# Patient Record
Sex: Female | Born: 1960 | Race: Black or African American | Hispanic: No | Marital: Married | Smoking: Never smoker
Health system: Southern US, Community
[De-identification: ages and names within clinical notes are randomized; demographics above are authoritative.]

## PROBLEM LIST (undated history)

## (undated) DIAGNOSIS — J45909 Unspecified asthma, uncomplicated: Secondary | ICD-10-CM

## (undated) DIAGNOSIS — I10 Essential (primary) hypertension: Secondary | ICD-10-CM

## (undated) DIAGNOSIS — R4189 Other symptoms and signs involving cognitive functions and awareness: Secondary | ICD-10-CM

## (undated) DIAGNOSIS — F319 Bipolar disorder, unspecified: Secondary | ICD-10-CM

## (undated) HISTORY — PX: TONSILLECTOMY: SUR1361

## (undated) HISTORY — PX: CHOLECYSTECTOMY: SHX55

## (undated) HISTORY — PX: ABDOMINAL HYSTERECTOMY: SHX81

## (undated) HISTORY — PX: EYE SURGERY: SHX253

---

## 2001-07-24 ENCOUNTER — Encounter: Admission: RE | Admit: 2001-07-24 | Discharge: 2001-07-24 | Payer: Self-pay | Admitting: Internal Medicine

## 2001-09-04 ENCOUNTER — Encounter: Admission: RE | Admit: 2001-09-04 | Discharge: 2001-09-04 | Payer: Self-pay | Admitting: Internal Medicine

## 2001-09-17 ENCOUNTER — Encounter: Admission: RE | Admit: 2001-09-17 | Discharge: 2001-10-28 | Payer: Self-pay | Admitting: Internal Medicine

## 2002-01-22 ENCOUNTER — Encounter: Admission: RE | Admit: 2002-01-22 | Discharge: 2002-01-22 | Payer: Self-pay | Admitting: Internal Medicine

## 2002-01-28 ENCOUNTER — Encounter: Admission: RE | Admit: 2002-01-28 | Discharge: 2002-01-28 | Payer: Self-pay | Admitting: Internal Medicine

## 2002-02-19 ENCOUNTER — Encounter: Admission: RE | Admit: 2002-02-19 | Discharge: 2002-02-19 | Payer: Self-pay | Admitting: Internal Medicine

## 2002-04-29 ENCOUNTER — Encounter: Admission: RE | Admit: 2002-04-29 | Discharge: 2002-04-29 | Payer: Self-pay | Admitting: Infectious Diseases

## 2009-08-13 ENCOUNTER — Encounter: Payer: Self-pay | Admitting: Emergency Medicine

## 2009-08-14 ENCOUNTER — Ambulatory Visit: Payer: Self-pay | Admitting: Psychiatry

## 2009-08-14 ENCOUNTER — Inpatient Hospital Stay (HOSPITAL_COMMUNITY): Admission: RE | Admit: 2009-08-14 | Discharge: 2009-08-19 | Payer: Self-pay | Admitting: Psychiatry

## 2010-03-31 LAB — GLUCOSE, CAPILLARY
Glucose-Capillary: 141 mg/dL — ABNORMAL HIGH (ref 70–99)
Glucose-Capillary: 146 mg/dL — ABNORMAL HIGH (ref 70–99)
Glucose-Capillary: 148 mg/dL — ABNORMAL HIGH (ref 70–99)
Glucose-Capillary: 169 mg/dL — ABNORMAL HIGH (ref 70–99)
Glucose-Capillary: 175 mg/dL — ABNORMAL HIGH (ref 70–99)
Glucose-Capillary: 177 mg/dL — ABNORMAL HIGH (ref 70–99)
Glucose-Capillary: 179 mg/dL — ABNORMAL HIGH (ref 70–99)
Glucose-Capillary: 182 mg/dL — ABNORMAL HIGH (ref 70–99)
Glucose-Capillary: 187 mg/dL — ABNORMAL HIGH (ref 70–99)
Glucose-Capillary: 198 mg/dL — ABNORMAL HIGH (ref 70–99)

## 2010-04-01 LAB — RAPID URINE DRUG SCREEN, HOSP PERFORMED
Amphetamines: NOT DETECTED
Cocaine: NOT DETECTED
Opiates: NOT DETECTED
Tetrahydrocannabinol: NOT DETECTED

## 2010-04-01 LAB — DIFFERENTIAL
Basophils Absolute: 0 10*3/uL (ref 0.0–0.1)
Basophils Relative: 0 % (ref 0–1)
Eosinophils Relative: 2 % (ref 0–5)
Lymphocytes Relative: 38 % (ref 12–46)
Monocytes Relative: 6 % (ref 3–12)
Neutro Abs: 5 10*3/uL (ref 1.7–7.7)

## 2010-04-01 LAB — URINALYSIS, ROUTINE W REFLEX MICROSCOPIC
Bilirubin Urine: NEGATIVE
Ketones, ur: NEGATIVE mg/dL
Nitrite: NEGATIVE
Specific Gravity, Urine: 1.01 (ref 1.005–1.030)
Urobilinogen, UA: 1 mg/dL (ref 0.0–1.0)

## 2010-04-01 LAB — CBC
MCH: 29.7 pg (ref 26.0–34.0)
MCV: 87.7 fL (ref 78.0–100.0)
Platelets: 309 10*3/uL (ref 150–400)
RDW: 13.7 % (ref 11.5–15.5)

## 2010-04-01 LAB — COMPREHENSIVE METABOLIC PANEL
Albumin: 4.1 g/dL (ref 3.5–5.2)
BUN: 13 mg/dL (ref 6–23)
Calcium: 9.7 mg/dL (ref 8.4–10.5)
Creatinine, Ser: 0.75 mg/dL (ref 0.4–1.2)
Total Bilirubin: 0.6 mg/dL (ref 0.3–1.2)
Total Protein: 7.7 g/dL (ref 6.0–8.3)

## 2010-04-01 LAB — GLUCOSE, CAPILLARY
Glucose-Capillary: 142 mg/dL — ABNORMAL HIGH (ref 70–99)
Glucose-Capillary: 216 mg/dL — ABNORMAL HIGH (ref 70–99)

## 2010-04-01 LAB — SALICYLATE LEVEL: Salicylate Lvl: <4 mg/dL (ref 2.8–20.0)

## 2010-04-01 LAB — ACETAMINOPHEN LEVEL: Acetaminophen (Tylenol), Serum: <10 ug/mL — ABNORMAL LOW (ref 10–30)

## 2010-08-17 ENCOUNTER — Emergency Department (HOSPITAL_BASED_OUTPATIENT_CLINIC_OR_DEPARTMENT_OTHER)
Admission: EM | Admit: 2010-08-17 | Discharge: 2010-08-17 | Disposition: A | Discharge status: Home / Self Care | Payer: Medicaid Other | Attending: Emergency Medicine | Admitting: Emergency Medicine

## 2010-08-17 ENCOUNTER — Encounter: Payer: Self-pay | Admitting: *Deleted

## 2010-08-17 DIAGNOSIS — R112 Nausea with vomiting, unspecified: Secondary | ICD-10-CM

## 2010-08-17 DIAGNOSIS — F319 Bipolar disorder, unspecified: Secondary | ICD-10-CM | POA: Insufficient documentation

## 2010-08-17 DIAGNOSIS — R1033 Periumbilical pain: Secondary | ICD-10-CM | POA: Insufficient documentation

## 2010-08-17 DIAGNOSIS — E119 Type 2 diabetes mellitus without complications: Secondary | ICD-10-CM | POA: Insufficient documentation

## 2010-08-17 DIAGNOSIS — I10 Essential (primary) hypertension: Secondary | ICD-10-CM | POA: Insufficient documentation

## 2010-08-17 HISTORY — DX: Bipolar disorder, unspecified: F31.9

## 2010-08-17 HISTORY — DX: Essential (primary) hypertension: I10

## 2010-08-17 LAB — COMPREHENSIVE METABOLIC PANEL
BUN: 21 mg/dL (ref 6–23)
Calcium: 9.7 mg/dL (ref 8.4–10.5)
GFR calc Af Amer: 52 mL/min — ABNORMAL LOW (ref >60)
GFR calc non Af Amer: 43 mL/min — ABNORMAL LOW (ref >60)
Glucose, Bld: 210 mg/dL — ABNORMAL HIGH (ref 70–99)
Total Protein: 8 g/dL (ref 6.0–8.3)

## 2010-08-17 LAB — CBC
HCT: 37.1 % (ref 36.0–46.0)
Hemoglobin: 12.3 g/dL (ref 12.0–15.0)
MCH: 28.7 pg (ref 26.0–34.0)
MCV: 86.7 fL (ref 78.0–100.0)
RBC: 4.28 MIL/uL (ref 3.87–5.11)

## 2010-08-17 LAB — LIPASE, BLOOD: Lipase: 45 U/L (ref 11–59)

## 2010-08-17 LAB — DIFFERENTIAL
Eosinophils Absolute: 0.2 10*3/uL (ref 0.0–0.7)
Eosinophils Relative: 1 % (ref 0–5)
Lymphs Abs: 3.1 10*3/uL (ref 0.7–4.0)
Monocytes Absolute: 0.8 10*3/uL (ref 0.1–1.0)
Monocytes Relative: 5 % (ref 3–12)

## 2010-08-17 MED ORDER — SODIUM CHLORIDE 0.9 % IV SOLN
999.0000 mL | INTRAVENOUS | Status: DC
  Administered 2010-08-17: 1000 mL via INTRAVENOUS

## 2010-08-17 MED ORDER — ONDANSETRON HCL 4 MG/2ML IJ SOLN
4.0000 mg | Freq: Once | INTRAMUSCULAR | Status: AC
  Administered 2010-08-17: 4 mg via INTRAVENOUS
  Filled 2010-08-17: qty 2

## 2010-08-17 NOTE — ED Provider Notes (Addendum)
History     CSN: [id number] Arrival date & time: 08/17/2010  9:17 AM  Chief Complaint  Patient presents with  . Abdominal Pain   HPI Comments: Pt was at [facility].  She had several tests including a CT scan.  Patient is a 50 y.o. female presenting with abdominal pain. The history is provided by the patient.  Abdominal Pain The primary symptoms of the illness include abdominal pain, nausea, vomiting and diarrhea. The primary symptoms of the illness do not include fever. Episode onset: several days. The onset of the illness was gradual. The problem has not changed since onset. The abdominal pain is located in the periumbilical region. The abdominal pain does not radiate. Pain scale: moderate. The abdominal pain is exacerbated by vomiting and eating.  Vomiting occurs 2 to 5 times per day.  The diarrhea occurs 2 to 4 times per day.  The patient states that she believes she is currently not pregnant. Additional symptoms associated with the illness include anorexia. Symptoms associated with the illness do not include chills, constipation, urgency or back pain.    Past Medical History  Diagnosis Date  . Diabetes mellitus   . Hypertension   . Glaucoma   . Bipolar 1 disorder     Past Surgical History  Procedure Date  . Tonsillectomy   . Cholecystectomy   . Abdominal hysterectomy   . Eye surgery     History reviewed. No pertinent family history.  History  Substance Use Topics  . Smoking status: Never Smoker   . Smokeless tobacco: Not on file  . Alcohol Use: No    OB History    Grav Para Term Preterm Abortions TAB SAB Ect Mult Living                  Review of Systems  Constitutional: Negative for fever and chills.  Gastrointestinal: Positive for nausea, vomiting, abdominal pain, diarrhea and anorexia. Negative for constipation.  Genitourinary: Negative for urgency.  Musculoskeletal: Negative for back pain.  All other systems reviewed and are  negative.    Physical Exam  BP 109/69  Pulse 99  Temp(Src) 98.5 F (36.9 C) (Oral)  Resp 18  SpO2 97%  Physical Exam  Nursing note and vitals reviewed. Constitutional: No distress.       obese  HENT:  Head: Normocephalic and atraumatic.  Right Ear: External ear normal.  Left Ear: External ear normal.  Eyes: Conjunctivae are normal. Right eye exhibits no discharge. Left eye exhibits no discharge. No scleral icterus.  Neck: Neck supple. No tracheal deviation present.  Cardiovascular: Normal rate, regular rhythm and intact distal pulses.   Pulmonary/Chest: Effort normal and breath sounds normal. No stridor. No respiratory distress. She has no wheezes. She has no rales.  Abdominal: Soft. Bowel sounds are normal. She exhibits no distension. There is tenderness (mild) in the periumbilical area. There is no rebound and no guarding. No hernia.  Musculoskeletal: She exhibits no edema and no tenderness.  Neurological: She is alert. She has normal strength. No sensory deficit. Cranial nerve deficit:  no gross defecits noted. She exhibits normal muscle tone. She displays no seizure activity. Coordination normal.  Skin: Skin is warm and dry. No rash noted.  Psychiatric: She has a normal mood and affect.    ED Course  Procedures Labs Reviewed  CBC - Abnormal; Notable for the following:    WBC 14.0 (*)    All other components within normal limits  DIFFERENTIAL - Abnormal; Notable  for the following:    Neutro Abs 10.0 (*)    All other components within normal limits  COMPREHENSIVE METABOLIC PANEL - Abnormal; Notable for the following:    Glucose, Bld 210 (*)    Creatinine, Ser 1.30 (*)    GFR calc non Af Amer 43 (*)    GFR calc Af Amer 52 (*)    All other components within normal limits  LIPASE, BLOOD  URINALYSIS, ROUTINE W REFLEX MICROSCOPIC    MDM We obtained records from patient's visit at [facility]. Patient was seen on August 2 and number of tests.  Patient did have a white count of 14.3 but otherwise normal CBC she she did have a essentially normal electrolytes lightheaded all with the exception of glucose 160 cardiac markers were normal with the exception of CK of 275 but her MB was 1.6 and relative index is 0.6. 2 negative UA. Patient also had a CT scan of her abdomen and pelvis that showed prior cholecystectomy diffuse fatty infiltration of liver small hiatal hernia but otherwise no acute findings. She was given a prescription for Percocet and Zofran however she did not fill those. Patient also had a lithium level checked as well that was in the therapeutic range.  At this point it does not appear to be any signs of any acute emergency medical condition. Her hiatal hernia could be giving her trouble she could have gastritis ulcer type symptoms. Patient also could have a viral gastroenteritis. Based on her previous test at [facility] but does not appear to be any signs of diverticulitis colitis, abscess, aneurysm. I discussed these findings with the daughter and the patient.  Patient was not able to give [place] a urine sample however she just did have one done at [facility] earlier this morning.     Celene Kras, MD 08/17/10 1238  Celene Kras, MD 09/14/10 [phone]

## 2010-08-17 NOTE — ED Notes (Signed)
Patient was seen at [facility] last night and a copy of her labs and radiology report were obtained.

## 2010-08-17 NOTE — ED Notes (Signed)
Per patient and family member patient has been vomiting over the past week with abdominal pain, unable to hold down fluids, was seen at [facility] room last night and given fluids, but did not fill prescription for zofran, states that she takes lithium and concerned about current level

## 2014-08-10 ENCOUNTER — Emergency Department (HOSPITAL_BASED_OUTPATIENT_CLINIC_OR_DEPARTMENT_OTHER): Payer: Medicaid Other

## 2014-08-10 ENCOUNTER — Encounter (HOSPITAL_BASED_OUTPATIENT_CLINIC_OR_DEPARTMENT_OTHER): Payer: Self-pay | Admitting: *Deleted

## 2014-08-10 ENCOUNTER — Emergency Department (HOSPITAL_BASED_OUTPATIENT_CLINIC_OR_DEPARTMENT_OTHER)
Admission: EM | Admit: 2014-08-10 | Discharge: 2014-08-11 | Disposition: A | Discharge status: Home / Self Care | Payer: Medicaid Other | Attending: Emergency Medicine | Admitting: Emergency Medicine

## 2014-08-10 DIAGNOSIS — E1165 Type 2 diabetes mellitus with hyperglycemia: Secondary | ICD-10-CM | POA: Diagnosis not present

## 2014-08-10 DIAGNOSIS — R609 Edema, unspecified: Secondary | ICD-10-CM

## 2014-08-10 DIAGNOSIS — R600 Localized edema: Secondary | ICD-10-CM | POA: Insufficient documentation

## 2014-08-10 DIAGNOSIS — I10 Essential (primary) hypertension: Secondary | ICD-10-CM | POA: Insufficient documentation

## 2014-08-10 DIAGNOSIS — R0789 Other chest pain: Secondary | ICD-10-CM | POA: Diagnosis not present

## 2014-08-10 DIAGNOSIS — F319 Bipolar disorder, unspecified: Secondary | ICD-10-CM | POA: Insufficient documentation

## 2014-08-10 DIAGNOSIS — Z880 Allergy status to penicillin: Secondary | ICD-10-CM | POA: Insufficient documentation

## 2014-08-10 DIAGNOSIS — Z794 Long term (current) use of insulin: Secondary | ICD-10-CM | POA: Insufficient documentation

## 2014-08-10 DIAGNOSIS — R079 Chest pain, unspecified: Secondary | ICD-10-CM | POA: Diagnosis present

## 2014-08-10 DIAGNOSIS — R739 Hyperglycemia, unspecified: Secondary | ICD-10-CM

## 2014-08-10 DIAGNOSIS — Z8669 Personal history of other diseases of the nervous system and sense organs: Secondary | ICD-10-CM | POA: Diagnosis not present

## 2014-08-10 DIAGNOSIS — Z79899 Other long term (current) drug therapy: Secondary | ICD-10-CM | POA: Diagnosis not present

## 2014-08-10 LAB — CBC
HCT: 34.5 % — ABNORMAL LOW (ref 36.0–46.0)
HEMOGLOBIN: 11.1 g/dL — AB (ref 12.0–15.0)
MCH: 29.4 pg (ref 26.0–34.0)
MCHC: 32.2 g/dL (ref 30.0–36.0)
MCV: 91.3 fL (ref 78.0–100.0)
Platelets: 269 10*3/uL (ref 150–400)
RBC: 3.78 MIL/uL — ABNORMAL LOW (ref 3.87–5.11)
RDW: 14 % (ref 11.5–15.5)
WBC: 9.8 10*3/uL (ref 4.0–10.5)

## 2014-08-10 LAB — COMPREHENSIVE METABOLIC PANEL
ALBUMIN: 3.3 g/dL — AB (ref 3.5–5.0)
ALT: 13 U/L — ABNORMAL LOW (ref 14–54)
ANION GAP: 9 (ref 5–15)
AST: 19 U/L (ref 15–41)
Alkaline Phosphatase: 74 U/L (ref 38–126)
BUN: 11 mg/dL (ref 6–20)
CALCIUM: 8.7 mg/dL — AB (ref 8.9–10.3)
CHLORIDE: 104 mmol/L (ref 101–111)
CO2: 25 mmol/L (ref 22–32)
CREATININE: 1 mg/dL (ref 0.44–1.00)
GFR calc non Af Amer: >60 mL/min (ref >60)
Glucose, Bld: 314 mg/dL — ABNORMAL HIGH (ref 65–99)
Potassium: 4.1 mmol/L (ref 3.5–5.1)
Sodium: 138 mmol/L (ref 135–145)
Total Bilirubin: 0.2 mg/dL — ABNORMAL LOW (ref 0.3–1.2)
Total Protein: 6.9 g/dL (ref 6.5–8.1)

## 2014-08-10 LAB — TROPONIN I

## 2014-08-10 NOTE — ED Provider Notes (Signed)
CSN: [id number]     Arrival date & time 08/10/14  2210 History  This chart was scribed for Sharon Libra, MD by Placido Sou, ED scribe. This patient was seen in room MH04/MH04 and the patient's care was started at 11:05 PM.  Chief Complaint  Patient presents with  . Chest Pain    HPI  HPI Comments: Sharon Lara is a 54 y.o. female, with a hx of DM, who presents to the Emergency Department complaining of acute, mild, bilateral foot swelling with onset 2 days ago. She notes associated SOB, diaphoresis and left upper CP with onset 4 hours ago which lasted 30 minutes. She describes her CP as sharp, well localized, rated it as 7/10; it worsened when breathing and is no longer present. She notes her cardiologist is Dr. Sampson Goon in [facility]. Pt denies any nausea or vomiting.  Past Medical History  Diagnosis Date  . Diabetes mellitus   . Hypertension   . Glaucoma   . Bipolar 1 disorder    Past Surgical History  Procedure Laterality Date  . Tonsillectomy    . Cholecystectomy    . Abdominal hysterectomy    . Eye surgery     History reviewed. No pertinent family history. History  Substance Use Topics  . Smoking status: Never Smoker   . Smokeless tobacco: Not on file  . Alcohol Use: No   OB History    No data available     Review of Systems  All other systems reviewed and are negative.   Allergies  Codeine; Flagyl; Penicillins; and Percocet  Home Medications   Prior to Admission medications   Medication Sig Start Date End Date Taking? Authorizing Provider  benztropine (COGENTIN) 0.5 MG tablet Take 0.5 mg by mouth 2 (two) times daily.      Historical Provider, MD  brimonidine-timolol (COMBIGAN) 0.2-0.5 % ophthalmic solution Place 1 drop into both eyes every 12 (twelve) hours.      Historical Provider, MD  clonazePAM (KLONOPIN) 0.5 MG tablet Take 0.5 mg by mouth 2 (two) times daily as needed. As needed fr anxiety     Historical Provider, MD  hydrochlorothiazide 25 MG  tablet Take 25 mg by mouth daily.      Historical Provider, MD  insulin aspart (NOVOLOG) 100 UNIT/ML injection Inject 45 Units into the skin 3 (three) times daily before meals.      Historical Provider, MD  insulin glargine (LANTUS) 100 UNIT/ML injection Inject 80 Units into the skin at bedtime.      Historical Provider, MD  latanoprost (XALATAN) 0.005 % ophthalmic solution Place 1 drop into both eyes at bedtime.      Historical Provider, MD  lisinopril (PRINIVIL,ZESTRIL) 20 MG tablet Take 20 mg by mouth daily.      Historical Provider, MD  lithium carbonate 300 MG capsule Take 300 mg by mouth 2 (two) times daily with a meal.      Historical Provider, MD  metFORMIN (GLUCOPHAGE) 500 MG tablet Take 1,000 mg by mouth 2 (two) times daily after a meal.      Historical Provider, MD  oxybutynin (DITROPAN) 5 MG tablet Take 5 mg by mouth 2 (two) times daily.      Historical Provider, MD  pindolol (VISKEN) 5 MG tablet Take 5 mg by mouth 2 (two) times daily.      Historical Provider, MD  risperiDONE (RISPERDAL) 1 MG tablet Take 1 mg by mouth daily.      Historical Provider, MD  risperidone (  RISPERDAL) 4 MG tablet Take 4 mg by mouth at bedtime.      Historical Provider, MD  risperiDONE microspheres (RISPERDAL CONSTA) 25 MG injection Inject 25 mg into the muscle every 14 (fourteen) days. Due for injection 08-17-2010     Historical Provider, MD  simvastatin (ZOCOR) 80 MG tablet Take 80 mg by mouth at bedtime.      Historical Provider, MD   BP 112/51 mmHg  Pulse 88  Temp(Src) 98 F (36.7 C) (Oral)  Resp 20  SpO2 98%   Physical Exam  General: Well-developed, well-nourished female in no acute distress; appearance consistent with age of record HENT: normocephalic; atraumatic Eyes: pupils equal, round and reactive to light; extraocular muscles intact Neck: supple Heart: regular rate and rhythm; no murmurs, rubs or gallops, bilateral anterior chest wall tenderness Lungs: clear to auscultation  bilaterally Abdomen: soft; nondistended; nontender; no masses or hepatosplenomegaly; bowel sounds present Extremities: No deformity; full range of motion; pulses normal; 1-2+ pitting edema of lower legs Neurologic: Awake, alert and oriented; motor function intact in all extremities and symmetric; no facial droop Skin: Warm and dry Psychiatric: Normal mood and affect  ED Course  Procedures  COORDINATION OF CARE: 11:11 PM Discussed treatment plan with pt at bedside and pt agreed to plan.   EKG Interpretation   Date/Time:  Tuesday August 10 2014 22:20:22 EDT Ventricular Rate:  86 PR Interval:  204 QRS Duration: 52 QT Interval:  332 QTC Calculation: 397 R Axis:   -28 Text Interpretation:  Normal sinus rhythm Low voltage QRS Inferior infarct  , age undetermined Anterolateral infarct , age undetermined Abnormal ECG  No previous ECGs available Confirmed by Read Drivers  MD, Jonny Ruiz ([id number]) on  08/10/2014 10:37:27 PM      MDM   Nursing notes and vitals signs, including pulse oximetry, reviewed.  Summary of this visit's results, reviewed by myself:  Labs:  Results for orders placed or performed during the hospital encounter of 08/10/14 (from the past 24 hour(s))  Troponin I     Status: None   Collection Time: 08/10/14 10:31 PM  Result Value Ref Range   Troponin I <0.03 <0.031 ng/mL  CBC     Status: Abnormal   Collection Time: 08/10/14 10:31 PM  Result Value Ref Range   WBC 9.8 4.0 - 10.5 K/uL   RBC 3.78 (L) 3.87 - 5.11 MIL/uL   Hemoglobin 11.1 (L) 12.0 - 15.0 g/dL   HCT 60.4 (L) 54.0 - 98.1 %   MCV 91.3 78.0 - 100.0 fL   MCH 29.4 26.0 - 34.0 pg   MCHC 32.2 30.0 - 36.0 g/dL   RDW 19.1 47.8 - 29.5 %   Platelets 269 150 - 400 K/uL  Comprehensive metabolic panel     Status: Abnormal   Collection Time: 08/10/14 10:31 PM  Result Value Ref Range   Sodium 138 135 - 145 mmol/L   Potassium 4.1 3.5 - 5.1 mmol/L   Chloride 104 101 - 111 mmol/L   CO2 25 22 - 32 mmol/L   Glucose, Bld 314 (H)  65 - 99 mg/dL   BUN 11 6 - 20 mg/dL   Creatinine, Ser 6.21 0.44 - 1.00 mg/dL   Calcium 8.7 (L) 8.9 - 10.3 mg/dL   Total Protein 6.9 6.5 - 8.1 g/dL   Albumin 3.3 (L) 3.5 - 5.0 g/dL   AST 19 15 - 41 U/L   ALT 13 (L) 14 - 54 U/L   Alkaline Phosphatase 74 38 - 126 U/L  Total Bilirubin 0.2 (L) 0.3 - 1.2 mg/dL   GFR calc non Af Amer >60 >60 mL/min   GFR calc Af Amer >60 >60 mL/min   Anion gap 9 5 - 15  Brain natriuretic peptide     Status: None   Collection Time: 08/10/14 10:31 PM  Result Value Ref Range   B Natriuretic Peptide 43.5 0.0 - 100.0 pg/mL  Lithium level     Status: Abnormal   Collection Time: 08/10/14 11:05 PM  Result Value Ref Range   Lithium Lvl <0.06 (L) 0.60 - 1.20 mmol/L  Troponin I     Status: None   Collection Time: 08/11/14  1:15 AM  Result Value Ref Range   Troponin I <0.03 <0.031 ng/mL    Imaging Studies: Dg Chest 2 View  08/10/2014   CLINICAL DATA:  Sharp substernal chest pain, onset at 18:30. At the time of onset, the pain was accompanied by dyspnea and diaphoresis.  EXAM: CHEST  2 VIEW  COMPARISON:  07/08/2014  FINDINGS: There is mild unchanged cardiomegaly. There are intact appearances of the transvenous lead. The lungs are clear. Pulmonary vasculature is normal. There is no pleural effusion. Hilar and mediastinal contours are unremarkable and unchanged.  IMPRESSION: Mild unchanged cardiomegaly.  No acute findings.   Electronically Signed   By: Ellery Plunk M.D.   On: 08/10/2014 22:47      Sharon Libra, MD 08/11/14 [phone]

## 2014-08-10 NOTE — ED Notes (Signed)
Pt states while watching tv tonight she began to experience sharp substernal chest pain approx 18:30 tonight - pt admits to shortness of breath and diaphoresis that time however symptoms have improved slightly. Pt w/ hx of lower extremity edema as well. Pt in no acute distress, speaking complete sentences at this time.

## 2014-08-10 NOTE — ED Notes (Signed)
Patient transported to X-ray 

## 2014-08-11 LAB — BRAIN NATRIURETIC PEPTIDE: B NATRIURETIC PEPTIDE 5: 43.5 pg/mL (ref 0.0–100.0)

## 2014-08-11 LAB — LITHIUM LEVEL: Lithium Lvl: <0.06 mmol/L — ABNORMAL LOW (ref 0.60–1.20)

## 2014-08-11 LAB — CBG MONITORING, ED: Glucose-Capillary: 291 mg/dL — ABNORMAL HIGH (ref 65–99)

## 2014-08-11 LAB — TROPONIN I: Troponin I: <0.03 ng/mL (ref <0.031)

## 2014-08-11 MED ORDER — FUROSEMIDE 20 MG PO TABS: ORAL_TABLET | ORAL | Status: AC

## 2014-08-11 MED ORDER — INSULIN REGULAR HUMAN 100 UNIT/ML IJ SOLN
10.0000 [IU] | Freq: Once | INTRAMUSCULAR | Status: AC
  Administered 2014-08-11: 10 [IU] via SUBCUTANEOUS
  Filled 2014-08-11: qty 1

## 2014-12-13 ENCOUNTER — Emergency Department (HOSPITAL_BASED_OUTPATIENT_CLINIC_OR_DEPARTMENT_OTHER)
Admission: EM | Admit: 2014-12-13 | Discharge: 2014-12-14 | Disposition: A | Discharge status: Home / Self Care | Payer: Medicaid Other | Attending: Emergency Medicine | Admitting: Emergency Medicine

## 2014-12-13 ENCOUNTER — Encounter (HOSPITAL_BASED_OUTPATIENT_CLINIC_OR_DEPARTMENT_OTHER): Payer: Self-pay

## 2014-12-13 ENCOUNTER — Emergency Department (HOSPITAL_BASED_OUTPATIENT_CLINIC_OR_DEPARTMENT_OTHER): Payer: Medicaid Other

## 2014-12-13 DIAGNOSIS — Y9289 Other specified places as the place of occurrence of the external cause: Secondary | ICD-10-CM | POA: Diagnosis not present

## 2014-12-13 DIAGNOSIS — W1839XA Other fall on same level, initial encounter: Secondary | ICD-10-CM | POA: Diagnosis not present

## 2014-12-13 DIAGNOSIS — Z79899 Other long term (current) drug therapy: Secondary | ICD-10-CM | POA: Insufficient documentation

## 2014-12-13 DIAGNOSIS — I10 Essential (primary) hypertension: Secondary | ICD-10-CM | POA: Insufficient documentation

## 2014-12-13 DIAGNOSIS — W19XXXA Unspecified fall, initial encounter: Secondary | ICD-10-CM

## 2014-12-13 DIAGNOSIS — Y998 Other external cause status: Secondary | ICD-10-CM | POA: Insufficient documentation

## 2014-12-13 DIAGNOSIS — F319 Bipolar disorder, unspecified: Secondary | ICD-10-CM | POA: Diagnosis not present

## 2014-12-13 DIAGNOSIS — Z880 Allergy status to penicillin: Secondary | ICD-10-CM | POA: Diagnosis not present

## 2014-12-13 DIAGNOSIS — Z043 Encounter for examination and observation following other accident: Secondary | ICD-10-CM | POA: Diagnosis not present

## 2014-12-13 DIAGNOSIS — Y9389 Activity, other specified: Secondary | ICD-10-CM | POA: Diagnosis not present

## 2014-12-13 DIAGNOSIS — H409 Unspecified glaucoma: Secondary | ICD-10-CM | POA: Diagnosis not present

## 2014-12-13 DIAGNOSIS — Z794 Long term (current) use of insulin: Secondary | ICD-10-CM | POA: Insufficient documentation

## 2014-12-13 DIAGNOSIS — E1165 Type 2 diabetes mellitus with hyperglycemia: Secondary | ICD-10-CM | POA: Diagnosis not present

## 2014-12-13 DIAGNOSIS — R739 Hyperglycemia, unspecified: Secondary | ICD-10-CM

## 2014-12-13 LAB — CBC WITH DIFFERENTIAL/PLATELET
BASOS ABS: 0 10*3/uL (ref 0.0–0.1)
BASOS PCT: 0 %
Eosinophils Absolute: 0.2 10*3/uL (ref 0.0–0.7)
Eosinophils Relative: 2 %
HEMATOCRIT: 37.2 % (ref 36.0–46.0)
HEMOGLOBIN: 11.9 g/dL — AB (ref 12.0–15.0)
Lymphocytes Relative: 35 %
Lymphs Abs: 3.8 10*3/uL (ref 0.7–4.0)
MCH: 28.7 pg (ref 26.0–34.0)
MCHC: 32 g/dL (ref 30.0–36.0)
MCV: 89.9 fL (ref 78.0–100.0)
Monocytes Absolute: 0.7 10*3/uL (ref 0.1–1.0)
Monocytes Relative: 6 %
NEUTROS ABS: 6.2 10*3/uL (ref 1.7–7.7)
NEUTROS PCT: 57 %
Platelets: 276 10*3/uL (ref 150–400)
RBC: 4.14 MIL/uL (ref 3.87–5.11)
RDW: 13 % (ref 11.5–15.5)
WBC: 10.9 10*3/uL — AB (ref 4.0–10.5)

## 2014-12-13 LAB — URINALYSIS, ROUTINE W REFLEX MICROSCOPIC
Bilirubin Urine: NEGATIVE
HGB URINE DIPSTICK: NEGATIVE
Ketones, ur: NEGATIVE mg/dL
Leukocytes, UA: NEGATIVE
Nitrite: NEGATIVE
Protein, ur: NEGATIVE mg/dL
SPECIFIC GRAVITY, URINE: 1.015 (ref 1.005–1.030)
pH: 6.5 (ref 5.0–8.0)

## 2014-12-13 LAB — CBG MONITORING, ED
GLUCOSE-CAPILLARY: 438 mg/dL — AB (ref 65–99)
Glucose-Capillary: 327 mg/dL — ABNORMAL HIGH (ref 65–99)

## 2014-12-13 LAB — URINE MICROSCOPIC-ADD ON: WBC, UA: NONE SEEN WBC/hpf (ref 0–5)

## 2014-12-13 MED ORDER — SODIUM CHLORIDE 0.9 % IV SOLN
INTRAVENOUS | Status: DC
  Administered 2014-12-13: 23:00:00 via INTRAVENOUS

## 2014-12-13 MED ORDER — SODIUM CHLORIDE 0.9 % IV BOLUS (SEPSIS)
250.0000 mL | Freq: Once | INTRAVENOUS | Status: AC
  Administered 2014-12-13: 250 mL via INTRAVENOUS

## 2014-12-13 NOTE — ED Notes (Signed)
Patient transported to X-ray 

## 2014-12-13 NOTE — ED Notes (Signed)
x1 assist to the bathroom collected sample

## 2014-12-13 NOTE — ED Notes (Signed)
C/o multiple falls over the last week-does not use cane or walker-pt alert/denies pain-was taken to [facility] via EMS but left due to long wait

## 2014-12-13 NOTE — ED Provider Notes (Addendum)
CSN: [id number]     Arrival date & time 12/13/14  1947 History  By signing my name below, I, Lyndel Safe, attest that this documentation has been prepared under the direction and in the presence of Vanetta Mulders, MD. Electronically Signed: Lyndel Safe, ED Scribe. 12/13/2014. 10:45 PM.   Chief Complaint  Patient presents with  . Fall   The history is provided by the patient and a relative. The history is limited by the condition of the patient. No language interpreter was used.   LEVEL 5 CAVEAT: ALTERED MENTAL STATUS   HPI Comments: Sharon Lara is a 54 y.o. female, with a h/o DM and HTN, brought in by ambulance, who presents to the Emergency Department complaining of frequent falls X 'a couple of days'. Per daughter the pt has fallen 3 times in the past 2 days, twice today and once yesterday. Pt does not ambulate with a walker or cane. The pt herself has no complaints of pain or any other complaints. She is followed by a PCP in [facility].   Past Medical History  Diagnosis Date  . Diabetes mellitus   . Hypertension   . Glaucoma   . Bipolar 1 disorder [facility])    Past Surgical History  Procedure Laterality Date  . Tonsillectomy    . Cholecystectomy    . Abdominal hysterectomy    . Eye surgery     No family history on file. Social History  Substance Use Topics  . Smoking status: Never Smoker   . Smokeless tobacco: None  . Alcohol Use: No   OB History    No data available     Review of Systems  Unable to perform ROS: Mental status change   Allergies  Codeine; Flagyl; Penicillins; and Percocet  Home Medications   Prior to Admission medications   Medication Sig Start Date End Date Taking? Authorizing Provider  carvedilol (COREG) 25 MG tablet Take 25 mg by mouth 2 (two) times daily with a meal.   Yes Historical Provider, MD  potassium chloride (KLOR-CON) 20 MEQ packet Take by mouth 2 (two) times daily.   Yes Historical Provider, MD  sertraline (ZOLOFT) 25  MG tablet Take 25 mg by mouth daily.   Yes Historical Provider, MD  benztropine (COGENTIN) 0.5 MG tablet Take 0.5 mg by mouth 2 (two) times daily.      Historical Provider, MD  brimonidine-timolol (COMBIGAN) 0.2-0.5 % ophthalmic solution Place 1 drop into both eyes every 12 (twelve) hours.      Historical Provider, MD  clonazePAM (KLONOPIN) 0.5 MG tablet Take 0.5 mg by mouth 2 (two) times daily as needed. As needed fr anxiety     Historical Provider, MD  furosemide (LASIX) 20 MG tablet Take 1 tablet daily as needed for leg swelling. 08/11/14   Paula Libra, MD  hydrochlorothiazide 25 MG tablet Take 25 mg by mouth daily.      Historical Provider, MD  insulin aspart (NOVOLOG) 100 UNIT/ML injection Inject 45 Units into the skin 3 (three) times daily before meals.      Historical Provider, MD  insulin glargine (LANTUS) 100 UNIT/ML injection Inject 80 Units into the skin at bedtime.      Historical Provider, MD  latanoprost (XALATAN) 0.005 % ophthalmic solution Place 1 drop into both eyes at bedtime.      Historical Provider, MD  lisinopril (PRINIVIL,ZESTRIL) 20 MG tablet Take 20 mg by mouth daily.      Historical Provider, MD  lithium carbonate 300  MG capsule Take 300 mg by mouth 2 (two) times daily with a meal.      Historical Provider, MD  metFORMIN (GLUCOPHAGE) 500 MG tablet Take 1,000 mg by mouth 2 (two) times daily after a meal.      Historical Provider, MD  oxybutynin (DITROPAN) 5 MG tablet Take 5 mg by mouth 2 (two) times daily.      Historical Provider, MD  pindolol (VISKEN) 5 MG tablet Take 5 mg by mouth 2 (two) times daily.      Historical Provider, MD  risperiDONE (RISPERDAL) 1 MG tablet Take 1 mg by mouth daily.      Historical Provider, MD  risperidone (RISPERDAL) 4 MG tablet Take 4 mg by mouth at bedtime.      Historical Provider, MD  risperiDONE microspheres (RISPERDAL CONSTA) 25 MG injection Inject 25 mg into the muscle every 14 (fourteen) days. Due for injection 08-17-2010     Historical  Provider, MD  simvastatin (ZOCOR) 80 MG tablet Take 80 mg by mouth at bedtime.      Historical Provider, MD   BP 103/65 mmHg  Pulse 82  Temp(Src) 98.5 F (36.9 C) (Oral)  Resp 20  Ht  (1.702 m)  Wt 274 lb (124.286 kg)  BMI 42.90 kg/m2  SpO2 100% Physical Exam  Constitutional: She is oriented to person, place, and time. She appears well-developed and well-nourished. No distress.  HENT:  Head: Normocephalic and atraumatic.  Mouth/Throat: Oropharynx is clear and moist.  Mucous membranes moist.   Eyes: Conjunctivae and EOM are normal. Pupils are equal, round, and reactive to light.  Neck: Normal range of motion.  Cardiovascular: Normal rate, regular rhythm and normal heart sounds.   Pulmonary/Chest: Effort normal and breath sounds normal. No respiratory distress. She has no wheezes.  Abdominal: Soft. Bowel sounds are normal. She exhibits no distension. There is no tenderness.  Musculoskeletal: Normal range of motion. She exhibits no edema.  No swelling to bilateral ankles.   Neurological: She is alert and oriented to person, place, and time. No cranial nerve deficit. She exhibits normal muscle tone. Coordination normal.  No pronator drift.   Skin: Skin is warm and dry.  Psychiatric: She has a normal mood and affect. Judgment normal.  Nursing note and vitals reviewed.   ED Course  Procedures  DIAGNOSTIC STUDIES: Oxygen Saturation is 100% on RA, normal by my interpretation.    COORDINATION OF CARE: 10:44 PM Discussed treatment plan with pt and daughter. Daughter and pt acknowledge and agree to plan.   Labs Review Labs Reviewed  URINALYSIS, ROUTINE W REFLEX MICROSCOPIC (NOT AT [facility]) - Abnormal; Notable for the following:    APPearance CLOUDY (*)    Glucose, UA >1000 (*)    All other components within normal limits  URINE MICROSCOPIC-ADD ON - Abnormal; Notable for the following:    Squamous Epithelial / LPF 0-5 (*)    Bacteria, UA MANY (*)    Casts HYALINE CASTS (*)     All other components within normal limits  CBC WITH DIFFERENTIAL/PLATELET - Abnormal; Notable for the following:    WBC 10.9 (*)    Hemoglobin 11.9 (*)    All other components within normal limits  COMPREHENSIVE METABOLIC PANEL - Abnormal; Notable for the following:    Sodium 134 (*)    Potassium 3.4 (*)    Chloride 95 (*)    Glucose, Bld 384 (*)    BUN 25 (*)    Creatinine, Ser 1.72 (*)  GFR calc non Af Amer 32 (*)    GFR calc Af Amer 37 (*)    All other components within normal limits  CBG MONITORING, ED - Abnormal; Notable for the following:    Glucose-Capillary 438 (*)    All other components within normal limits  CBG MONITORING, ED - Abnormal; Notable for the following:    Glucose-Capillary 327 (*)    All other components within normal limits  URINE CULTURE   Results for orders placed or performed during the hospital encounter of 12/13/14  Urinalysis, Routine w reflex microscopic (not at [facility])  Result Value Ref Range   Color, Urine YELLOW YELLOW   APPearance CLOUDY (A) CLEAR   Specific Gravity, Urine 1.015 1.005 - 1.030   pH 6.5 5.0 - 8.0   Glucose, UA >1000 (A) NEGATIVE mg/dL   Hgb urine dipstick NEGATIVE NEGATIVE   Bilirubin Urine NEGATIVE NEGATIVE   Ketones, ur NEGATIVE NEGATIVE mg/dL   Protein, ur NEGATIVE NEGATIVE mg/dL   Nitrite NEGATIVE NEGATIVE   Leukocytes, UA NEGATIVE NEGATIVE  Urine microscopic-add on  Result Value Ref Range   Squamous Epithelial / LPF 0-5 (A) NONE SEEN   WBC, UA NONE SEEN 0 - 5 WBC/hpf   RBC / HPF 0-5 0 - 5 RBC/hpf   Bacteria, UA MANY (A) NONE SEEN   Casts HYALINE CASTS (A) NEGATIVE  CBC with Differential/Platelet  Result Value Ref Range   WBC 10.9 (H) 4.0 - 10.5 K/uL   RBC 4.14 3.87 - 5.11 MIL/uL   Hemoglobin 11.9 (L) 12.0 - 15.0 g/dL   HCT 60.437.2 54.036.0 - 98.146.0 %   MCV 89.9 78.0 - 100.0 fL   MCH 28.7 26.0 - 34.0 pg   MCHC 32.0 30.0 - 36.0 g/dL   RDW 19.113.0 47.811.5 - 29.515.5 %   Platelets 276 150 - 400 K/uL   Neutrophils Relative % 57 %    Neutro Abs 6.2 1.7 - 7.7 K/uL   Lymphocytes Relative 35 %   Lymphs Abs 3.8 0.7 - 4.0 K/uL   Monocytes Relative 6 %   Monocytes Absolute 0.7 0.1 - 1.0 K/uL   Eosinophils Relative 2 %   Eosinophils Absolute 0.2 0.0 - 0.7 K/uL   Basophils Relative 0 %   Basophils Absolute 0.0 0.0 - 0.1 K/uL  Comprehensive metabolic panel  Result Value Ref Range   Sodium 134 (L) 135 - 145 mmol/L   Potassium 3.4 (L) 3.5 - 5.1 mmol/L   Chloride 95 (L) 101 - 111 mmol/L   CO2 29 22 - 32 mmol/L   Glucose, Bld 384 (H) 65 - 99 mg/dL   BUN 25 (H) 6 - 20 mg/dL   Creatinine, Ser 6.211.72 (H) 0.44 - 1.00 mg/dL   Calcium 9.4 8.9 - 30.810.3 mg/dL   Total Protein 7.6 6.5 - 8.1 g/dL   Albumin 3.9 3.5 - 5.0 g/dL   AST 22 15 - 41 U/L   ALT 15 14 - 54 U/L   Alkaline Phosphatase 80 38 - 126 U/L   Total Bilirubin 0.3 0.3 - 1.2 mg/dL   GFR calc non Af Amer 32 (L) >60 mL/min   GFR calc Af Amer 37 (L) >60 mL/min   Anion gap 10 5 - 15  POC CBG, ED  Result Value Ref Range   Glucose-Capillary 438 (H) 65 - 99 mg/dL  POC CBG, ED  Result Value Ref Range   Glucose-Capillary 327 (H) 65 - 99 mg/dL    Imaging Review Dg Chest 2 View  12/13/2014  CLINICAL DATA:  Multiple falls over the past 3 days. Initial encounter. EXAM: CHEST  2 VIEW COMPARISON:  Chest radiograph performed 08/16/2014 FINDINGS: The lungs are well-aerated and clear. There is no evidence of focal opacification, pleural effusion or pneumothorax. The heart is borderline normal in size. An AICD is noted at the left chest wall, with a single lead ending at the right ventricle. No acute osseous abnormalities are seen. IMPRESSION: No acute cardiopulmonary process seen. No displaced rib fractures identified. Electronically Signed   By: Roanna Raider M.D.   On: 12/13/2014 23:48   Ct Head Wo Contrast  12/13/2014  CLINICAL DATA:  Status post multiple falls. Patient may have hit her head on the wall. Initial encounter. EXAM: CT HEAD WITHOUT CONTRAST TECHNIQUE: Contiguous axial  images were obtained from the base of the skull through the vertex without intravenous contrast. COMPARISON:  None. FINDINGS: There is no evidence of acute infarction, mass lesion, or intra- or extra-axial hemorrhage on CT. Prominence of the ventricles and sulci reflects mild cortical volume loss. The brainstem and fourth ventricle are within normal limits. The basal ganglia are unremarkable in appearance. The cerebral hemispheres demonstrate grossly normal gray-white differentiation. No mass effect or midline shift is seen. There is no evidence of fracture; visualized osseous structures are unremarkable in appearance. The orbits are within normal limits. The paranasal sinuses and mastoid air cells are well-aerated. No significant soft tissue abnormalities are seen. IMPRESSION: 1. No evidence of traumatic intracranial injury or fracture. 2. Mild cortical volume loss noted. Electronically Signed   By: Roanna Raider M.D.   On: 12/13/2014 23:54   I have personally reviewed and evaluated these images and lab results as part of my medical decision-making.   EKG Interpretation None      MDM   Final diagnoses:  Falls, initial encounter  Hyperglycemia    Patient with workup for the frequent falls without any significant findings. Chest x-rays negative for pneumonia congestive heart failure or pneumothorax. Head CT without any acute findings. Patient with elevated blood sugar but no evidence of any metabolic acidosis no evidence of significant urinary tract infection although there was a lot of bacteria in the urine urine culture sent we'll not start on antibiotics patient without any real symptoms. No significant vital sign abnormalities. If urine culture shows evidence of UTI they will be notified and treated.  The patient is walking fine here without any significant difficulties.  I personally performed the services described in this documentation, which was scribed in my presence. The recorded  information has been reviewed and is accurate.      Vanetta Mulders, MD 12/14/14 1610  Vanetta Mulders, MD 12/14/14 0040

## 2014-12-13 NOTE — ED Notes (Signed)
2 times today and another incident last night. Pt seems to be oriented. Reports nausea and vomiting with belching.

## 2014-12-14 LAB — COMPREHENSIVE METABOLIC PANEL
ALBUMIN: 3.9 g/dL (ref 3.5–5.0)
ALT: 15 U/L (ref 14–54)
AST: 22 U/L (ref 15–41)
Alkaline Phosphatase: 80 U/L (ref 38–126)
Anion gap: 10 (ref 5–15)
BILIRUBIN TOTAL: 0.3 mg/dL (ref 0.3–1.2)
BUN: 25 mg/dL — AB (ref 6–20)
CO2: 29 mmol/L (ref 22–32)
CREATININE: 1.72 mg/dL — AB (ref 0.44–1.00)
Calcium: 9.4 mg/dL (ref 8.9–10.3)
Chloride: 95 mmol/L — ABNORMAL LOW (ref 101–111)
GFR calc Af Amer: 37 mL/min — ABNORMAL LOW (ref >60)
GFR, EST NON AFRICAN AMERICAN: 32 mL/min — AB (ref >60)
Glucose, Bld: 384 mg/dL — ABNORMAL HIGH (ref 65–99)
POTASSIUM: 3.4 mmol/L — AB (ref 3.5–5.1)
Sodium: 134 mmol/L — ABNORMAL LOW (ref 135–145)
TOTAL PROTEIN: 7.6 g/dL (ref 6.5–8.1)

## 2014-12-14 NOTE — Discharge Instructions (Signed)
Follow-up with her doctor for further evaluation. Workup here without any significant findings. Possibility of urinary tract infection is there will be confirmed by urine culture if present you will be notified.

## 2014-12-15 LAB — URINE CULTURE

## 2016-11-14 ENCOUNTER — Emergency Department (HOSPITAL_BASED_OUTPATIENT_CLINIC_OR_DEPARTMENT_OTHER)
Admission: EM | Admit: 2016-11-14 | Discharge: 2016-11-15 | Disposition: A | Discharge status: Home / Self Care | Payer: Medicaid Other | Attending: Emergency Medicine | Admitting: Emergency Medicine

## 2016-11-14 ENCOUNTER — Emergency Department (HOSPITAL_BASED_OUTPATIENT_CLINIC_OR_DEPARTMENT_OTHER): Payer: Medicaid Other

## 2016-11-14 ENCOUNTER — Encounter (HOSPITAL_BASED_OUTPATIENT_CLINIC_OR_DEPARTMENT_OTHER): Payer: Self-pay

## 2016-11-14 DIAGNOSIS — R0602 Shortness of breath: Secondary | ICD-10-CM | POA: Insufficient documentation

## 2016-11-14 DIAGNOSIS — I10 Essential (primary) hypertension: Secondary | ICD-10-CM | POA: Diagnosis not present

## 2016-11-14 DIAGNOSIS — Z79899 Other long term (current) drug therapy: Secondary | ICD-10-CM | POA: Insufficient documentation

## 2016-11-14 DIAGNOSIS — Z794 Long term (current) use of insulin: Secondary | ICD-10-CM | POA: Insufficient documentation

## 2016-11-14 DIAGNOSIS — E119 Type 2 diabetes mellitus without complications: Secondary | ICD-10-CM | POA: Diagnosis not present

## 2016-11-14 DIAGNOSIS — J45909 Unspecified asthma, uncomplicated: Secondary | ICD-10-CM | POA: Insufficient documentation

## 2016-11-14 HISTORY — DX: Other symptoms and signs involving cognitive functions and awareness: R41.89

## 2016-11-14 HISTORY — DX: Unspecified asthma, uncomplicated: J45.909

## 2016-11-14 LAB — BASIC METABOLIC PANEL
Anion gap: 8 (ref 5–15)
BUN: 10 mg/dL (ref 6–20)
CALCIUM: 9.4 mg/dL (ref 8.9–10.3)
CHLORIDE: 100 mmol/L — AB (ref 101–111)
CO2: 26 mmol/L (ref 22–32)
CREATININE: 1.05 mg/dL — AB (ref 0.44–1.00)
GFR calc non Af Amer: 57 mL/min — ABNORMAL LOW (ref >60)
Glucose, Bld: 347 mg/dL — ABNORMAL HIGH (ref 65–99)
Potassium: 3.5 mmol/L (ref 3.5–5.1)
SODIUM: 134 mmol/L — AB (ref 135–145)

## 2016-11-14 LAB — CBC WITH DIFFERENTIAL/PLATELET
BASOS ABS: 0 10*3/uL (ref 0.0–0.1)
Basophils Relative: 0 %
EOS ABS: 0.2 10*3/uL (ref 0.0–0.7)
EOS PCT: 2 %
HCT: 37.7 % (ref 36.0–46.0)
Hemoglobin: 12.3 g/dL (ref 12.0–15.0)
LYMPHS PCT: 43 %
Lymphs Abs: 3.4 10*3/uL (ref 0.7–4.0)
MCH: 28.6 pg (ref 26.0–34.0)
MCHC: 32.6 g/dL (ref 30.0–36.0)
MCV: 87.7 fL (ref 78.0–100.0)
MONO ABS: 0.5 10*3/uL (ref 0.1–1.0)
Monocytes Relative: 6 %
Neutro Abs: 3.8 10*3/uL (ref 1.7–7.7)
Neutrophils Relative %: 49 %
PLATELETS: 248 10*3/uL (ref 150–400)
RBC: 4.3 MIL/uL (ref 3.87–5.11)
RDW: 13.2 % (ref 11.5–15.5)
WBC: 7.9 10*3/uL (ref 4.0–10.5)

## 2016-11-14 LAB — CBG MONITORING, ED: Glucose-Capillary: 309 mg/dL — ABNORMAL HIGH (ref 65–99)

## 2016-11-14 LAB — TROPONIN I

## 2016-11-14 LAB — D-DIMER, QUANTITATIVE (NOT AT ARMC): D DIMER QUANT: 0.72 ug{FEU}/mL — AB (ref 0.00–0.50)

## 2016-11-14 MED ORDER — IOPAMIDOL (ISOVUE-370) INJECTION 76%
25.0000 mL | Freq: Once | INTRAVENOUS | Status: AC | PRN
  Administered 2016-11-14: 25 mL via INTRAVENOUS

## 2016-11-14 MED ORDER — SODIUM CHLORIDE 0.9 % IV BOLUS (SEPSIS)
500.0000 mL | Freq: Once | INTRAVENOUS | Status: AC
Start: 2016-11-14 — End: 2016-11-14
  Administered 2016-11-14: 500 mL via INTRAVENOUS

## 2016-11-14 MED ORDER — ASPIRIN 81 MG PO CHEW
324.0000 mg | CHEWABLE_TABLET | Freq: Once | ORAL | Status: AC
  Administered 2016-11-14: 324 mg via ORAL
  Filled 2016-11-14: qty 4

## 2016-11-14 MED ORDER — IOPAMIDOL (ISOVUE-370) INJECTION 76%
100.0000 mL | Freq: Once | INTRAVENOUS | Status: AC | PRN
  Administered 2016-11-14: 100 mL via INTRAVENOUS

## 2016-11-14 MED ORDER — ALBUTEROL SULFATE HFA 108 (90 BASE) MCG/ACT IN AERS
2.0000 | INHALATION_SPRAY | Freq: Once | RESPIRATORY_TRACT | Status: AC
  Administered 2016-11-14: 2 via RESPIRATORY_TRACT
  Filled 2016-11-14: qty 6.7

## 2016-11-14 NOTE — ED Notes (Signed)
ED Provider at bedside. 

## 2016-11-14 NOTE — ED Provider Notes (Signed)
[facility] EMERGENCY DEPARTMENT Provider Note   CSN: [id number] Arrival date & time: 11/14/16  2117     History   Chief Complaint Chief Complaint  Patient presents with  . Shortness of Breath    HPI Sharon Lara is a 56 y.o. female.  HPI  56 year old female with a history of diabetes, asthma, bipolar disorder presents with acute shortness of breath.  States it started about 1 hour prior to arrival.  She was laying in bed when it started.  She denies any cough or fever.  She states that she does have some sharp chest pain just left of midline and rates it as 8/10.  She states this is similar to when she had shortness of breath to go to the ER in the past but does not know what diagnosis she was given.  She has asthma and thinks it might be similar to that but she does not have an inhaler.  She has on and off left foot swelling that has been coming and going for months.  No back pain or abdominal pain.  The pain does not radiate from her chest.  Past Medical History:  Diagnosis Date  . Asthma   . Bipolar 1 disorder (HCC)   . Diabetes mellitus   . Glaucoma   . Hypertension     There are no active problems to display for this patient.   Past Surgical History:  Procedure Laterality Date  . ABDOMINAL HYSTERECTOMY    . CHOLECYSTECTOMY    . EYE SURGERY    . TONSILLECTOMY      OB History    No data available       Home Medications    Prior to Admission medications   Medication Sig Start Date End Date Taking? Authorizing Provider  hydrochlorothiazide 25 MG tablet Take 25 mg by mouth daily.     Yes [provider]  insulin aspart (NOVOLOG) 100 UNIT/ML injection Inject 45 Units into the skin 3 (three) times daily before meals.     Yes [provider]  insulin glargine (LANTUS) 100 UNIT/ML injection Inject 80 Units into the skin at bedtime.     Yes [provider]  lisinopril (PRINIVIL,ZESTRIL) 20 MG tablet Take 20 mg by mouth  daily.     Yes [provider]  lithium carbonate 300 MG capsule Take 300 mg by mouth 2 (two) times daily with a meal.     Yes [provider]  metFORMIN (GLUCOPHAGE) 500 MG tablet Take 1,000 mg by mouth 2 (two) times daily after a meal.     Yes [provider]  risperiDONE microspheres (RISPERDAL CONSTA) 25 MG injection Inject 25 mg into the muscle every 14 (fourteen) days. Due for injection 08-17-2010    Yes [provider]  sertraline (ZOLOFT) 25 MG tablet Take 25 mg by mouth daily.   Yes [provider]  benztropine (COGENTIN) 0.5 MG tablet Take 0.5 mg by mouth 2 (two) times daily.      [provider]  brimonidine-timolol (COMBIGAN) 0.2-0.5 % ophthalmic solution Place 1 drop into both eyes every 12 (twelve) hours.      [provider]  carvedilol (COREG) 25 MG tablet Take 25 mg by mouth 2 (two) times daily with a meal.    [provider]  clonazePAM (KLONOPIN) 0.5 MG tablet Take 0.5 mg by mouth 2 (two) times daily as needed. As needed fr anxiety     [provider]  furosemide (  LASIX) 20 MG tablet Take 1 tablet daily as needed for leg swelling. 08/11/14   Molpus, John, MD  latanoprost (XALATAN) 0.005 % ophthalmic solution Place 1 drop into both eyes at bedtime.      [provider]  oxybutynin (DITROPAN) 5 MG tablet Take 5 mg by mouth 2 (two) times daily.      [provider]  pindolol (VISKEN) 5 MG tablet Take 5 mg by mouth 2 (two) times daily.      [provider]  potassium chloride (KLOR-CON) 20 MEQ packet Take by mouth 2 (two) times daily.    [provider]  risperiDONE (RISPERDAL) 1 MG tablet Take 1 mg by mouth daily.      [provider]  risperidone (RISPERDAL) 4 MG tablet Take 4 mg by mouth at bedtime.      [provider]  simvastatin (ZOCOR) 80 MG tablet Take 80 mg by mouth at bedtime.      [provider]    Family History No family history  on file.  Social History Social History  Substance Use Topics  . Smoking status: Never Smoker  . Smokeless tobacco: Never Used  . Alcohol use No     Allergies   Chocolate; Codeine; Flagyl [metronidazole hcl]; Penicillins; and Percocet [oxycodone-acetaminophen]   Review of Systems Review of Systems  Constitutional: Negative for fever.  Respiratory: Positive for shortness of breath. Negative for cough.   Cardiovascular: Positive for chest pain and leg swelling (chronic, left foot).  Gastrointestinal: Negative for abdominal pain and vomiting.  Musculoskeletal: Negative for back pain.  All other systems reviewed and are negative.    Physical Exam Updated Vital Signs BP (!) 159/106   Pulse 86   Temp 98.5 F (36.9 C) (Oral)   Resp (!) 9   Ht 5\' 7"  (1.702 m)   Wt 110.2 kg (243 lb)   SpO2 93%   BMI 38.06 kg/m   Physical Exam  Constitutional: She is oriented to person, place, and time. She appears well-developed and well-nourished. No distress.  obese  HENT:  Head: Normocephalic and atraumatic.  Right Ear: External ear normal.  Left Ear: External ear normal.  Nose: Nose normal.  Eyes: Right eye exhibits no discharge. Left eye exhibits no discharge.  Cardiovascular: Normal rate, regular rhythm and normal heart sounds.   Pulses:      Radial pulses are 2+ on the right side, and 2+ on the left side.  HR low 100s, high 90s  Pulmonary/Chest: Effort normal and breath sounds normal. No accessory muscle usage. No tachypnea. She has no wheezes. She has no rales. She exhibits tenderness.    Speaks in complete sentences.   Abdominal: Soft. There is no tenderness.  Musculoskeletal: She exhibits no edema.  Neurological: She is alert and oriented to person, place, and time.  Skin: Skin is warm and dry. She is not diaphoretic.  Nursing note and vitals reviewed.    ED Treatments / Results  Labs (all labs ordered are listed, but only abnormal results are displayed) Labs  Reviewed  BASIC METABOLIC PANEL - Abnormal; Notable for the following:       Result Value   Sodium 134 (*)    Chloride 100 (*)    Glucose, Bld 347 (*)    Creatinine, Ser 1.05 (*)    GFR calc non Af Amer 57 (*)    All other components within normal limits  D-DIMER, QUANTITATIVE (NOT AT [facility]) - Abnormal; Notable for the following:  D-Dimer, Quant 0.72 (*)    All other components within normal limits  CBG MONITORING, ED - Abnormal; Notable for the following:    Glucose-Capillary 309 (*)    All other components within normal limits  TROPONIN I  CBC WITH DIFFERENTIAL/PLATELET  TROPONIN I    EKG  EKG Interpretation  Date/Time:  Wednesday November 14 2016 21:29:25 EDT Ventricular Rate:  99 PR Interval:    QRS Duration: 80 QT Interval:  432 QTC Calculation: 555 R Axis:   -36 Text Interpretation:  Sinus rhythm Inferior infarct, old Anterior infarct, old Lateral leads are also involved Prolonged QT interval Poor data quality otherwise no significant change since 2016 Confirmed by Pricilla Loveless [phone]) on 11/14/2016 9:45:35 PM       Radiology Dg Chest 2 View  Result Date: 11/14/2016 CLINICAL DATA:  Onset of dyspnea earlier this evening. EXAM: CHEST  2 VIEW COMPARISON:  09/19/2016 FINDINGS: ICD device with right ventricular lead in place. Heart is top normal with aortic atherosclerosis. No pneumonic consolidation, effusion or pneumothorax. No overt pulmonary edema. Degenerative changes are seen along the dorsal spine as before. IMPRESSION: 1. No active cardiopulmonary disease. 2. Aortic atherosclerosis. 3. ICD device with right ventricular lead in place. Electronically Signed   By: Tollie Eth M.D.   On: 11/14/2016 22:43   Ct Angio Chest Pe W/cm &/or Wo Cm  Result Date: 11/14/2016 CLINICAL DATA:  Shortness of breath and elevated D-dimer. EXAM: CT ANGIOGRAPHY CHEST WITH CONTRAST TECHNIQUE: Multidetector CT imaging of the chest was performed using the standard protocol during bolus  administration of intravenous contrast. Multiplanar CT image reconstructions and MIPs were obtained to evaluate the vascular anatomy. CONTRAST:  100 mL Isovue 370 COMPARISON:  Chest radiograph 11/14/2016 Chest CT 02/28/2016 FINDINGS: Cardiovascular: Contrast injection is sufficient to demonstrate satisfactory opacification of the pulmonary arteries to the segmental level. There is no pulmonary embolus. The main pulmonary artery is enlarged, measuring 3.4 cm at the bifurcation. There is no CT evidence of acute right heart strain. There is mild aortic atherosclerotic calcification. There is a normal 3-vessel arch branching pattern. Heart size is enlarged. No pericardial effusion. Mediastinum/Nodes: Patulous thoracic esophagus. No mediastinal or axillary adenopathy. Lungs/Pleura: No pulmonary nodules or masses. No pleural effusion or pneumothorax. No focal airspace consolidation. No focal pleural abnormality. Upper Abdomen: Contrast bolus timing is not optimized for evaluation of the abdominal organs. Within this limitation, the visualized organs of the upper abdomen are normal. Musculoskeletal: No chest wall abnormality. No acute or significant osseous findings. Review of the MIP images confirms the above findings. IMPRESSION: 1. No pulmonary embolus or other acute thoracic abnormality. 2. Enlarged main pulmonary artery may indicate underlying pulmonary hypertension. 3. Mild calcific aortic atherosclerosis (ICD10-I70.0). Electronically Signed   By: Deatra Robinson M.D.   On: 11/14/2016 23:32    Procedures Procedures (including critical care time)  Medications Ordered in ED Medications  albuterol (PROVENTIL HFA;VENTOLIN HFA) 108 (90 Base) MCG/ACT inhaler 2 puff (2 puffs Inhalation Given 11/14/16 2151)  aspirin chewable tablet 324 mg (324 mg Oral Given 11/14/16 2148)  sodium chloride 0.9 % bolus 500 mL (0 mLs Intravenous Stopped 11/14/16 2227)  iopamidol (ISOVUE-370) 76 % injection 100 mL (100 mLs Intravenous  Contrast Given 11/14/16 2240)  iopamidol (ISOVUE-370) 76 % injection 25 mL (25 mLs Intravenous Contrast Given 11/14/16 2246)     Initial Impression / Assessment and Plan / ED Course  I have reviewed the triage vital signs and the nursing notes.  Pertinent labs &  imaging results that were available during my care of the patient were reviewed by me and considered in my medical decision making (see chart for details).     Patient feels like her shortness of breath has improved after 2 puffs of albuterol inhaler.  Her chest pain is pretty atypical with it being sharp in nature and reproducible.  ECG with no acute ischemia and no significant change compared to baseline.  Her d-dimer is mildly positive and in the setting of low risk PE workup but not PERC negative I think she needed a CT scan.  Fortunately this is unremarkable for acute disease and no PE.  While she does have cardiac risk factors her presentation is so atypical that I think a second troponin and repeat ECG is a good strategy to help rule out acute MI and if negative she can be discharged home.  Care to Dr. Read Drivers with second troponin pending.  Final Clinical Impressions(s) / ED Diagnoses   Final diagnoses:  SOB (shortness of breath)    New Prescriptions New Prescriptions   No medications on file     Pricilla Loveless, MD 11/15/16 [phone]

## 2016-11-14 NOTE — ED Triage Notes (Signed)
Pt here [facility] with c/o SOB. Pt SpO2 97% on RA. Pt speaking in complete sentences. Pt ambulated into the department with a steady gate and in NAD. Pt from home.

## 2016-11-15 ENCOUNTER — Encounter (HOSPITAL_BASED_OUTPATIENT_CLINIC_OR_DEPARTMENT_OTHER): Payer: Self-pay | Admitting: Emergency Medicine

## 2016-11-15 LAB — TROPONIN I: Troponin I: <0.03 ng/mL (ref <0.03)

## 2016-11-15 NOTE — ED Notes (Signed)
Contacted [facility] for transport to patient's home. 

## 2016-11-15 NOTE — ED Notes (Signed)
[facility] arrived for transportation back home. Pt verbalized understanding of d/c instructions.

## 2019-05-26 IMAGING — CR DG CHEST 2V
2 series · 2 of 2 positions shown · non-contrast
Comparison: 09/19/2016

CLINICAL DATA: Onset of dyspnea earlier this evening.

EXAM:
CHEST  2 VIEW

[w chest pa]
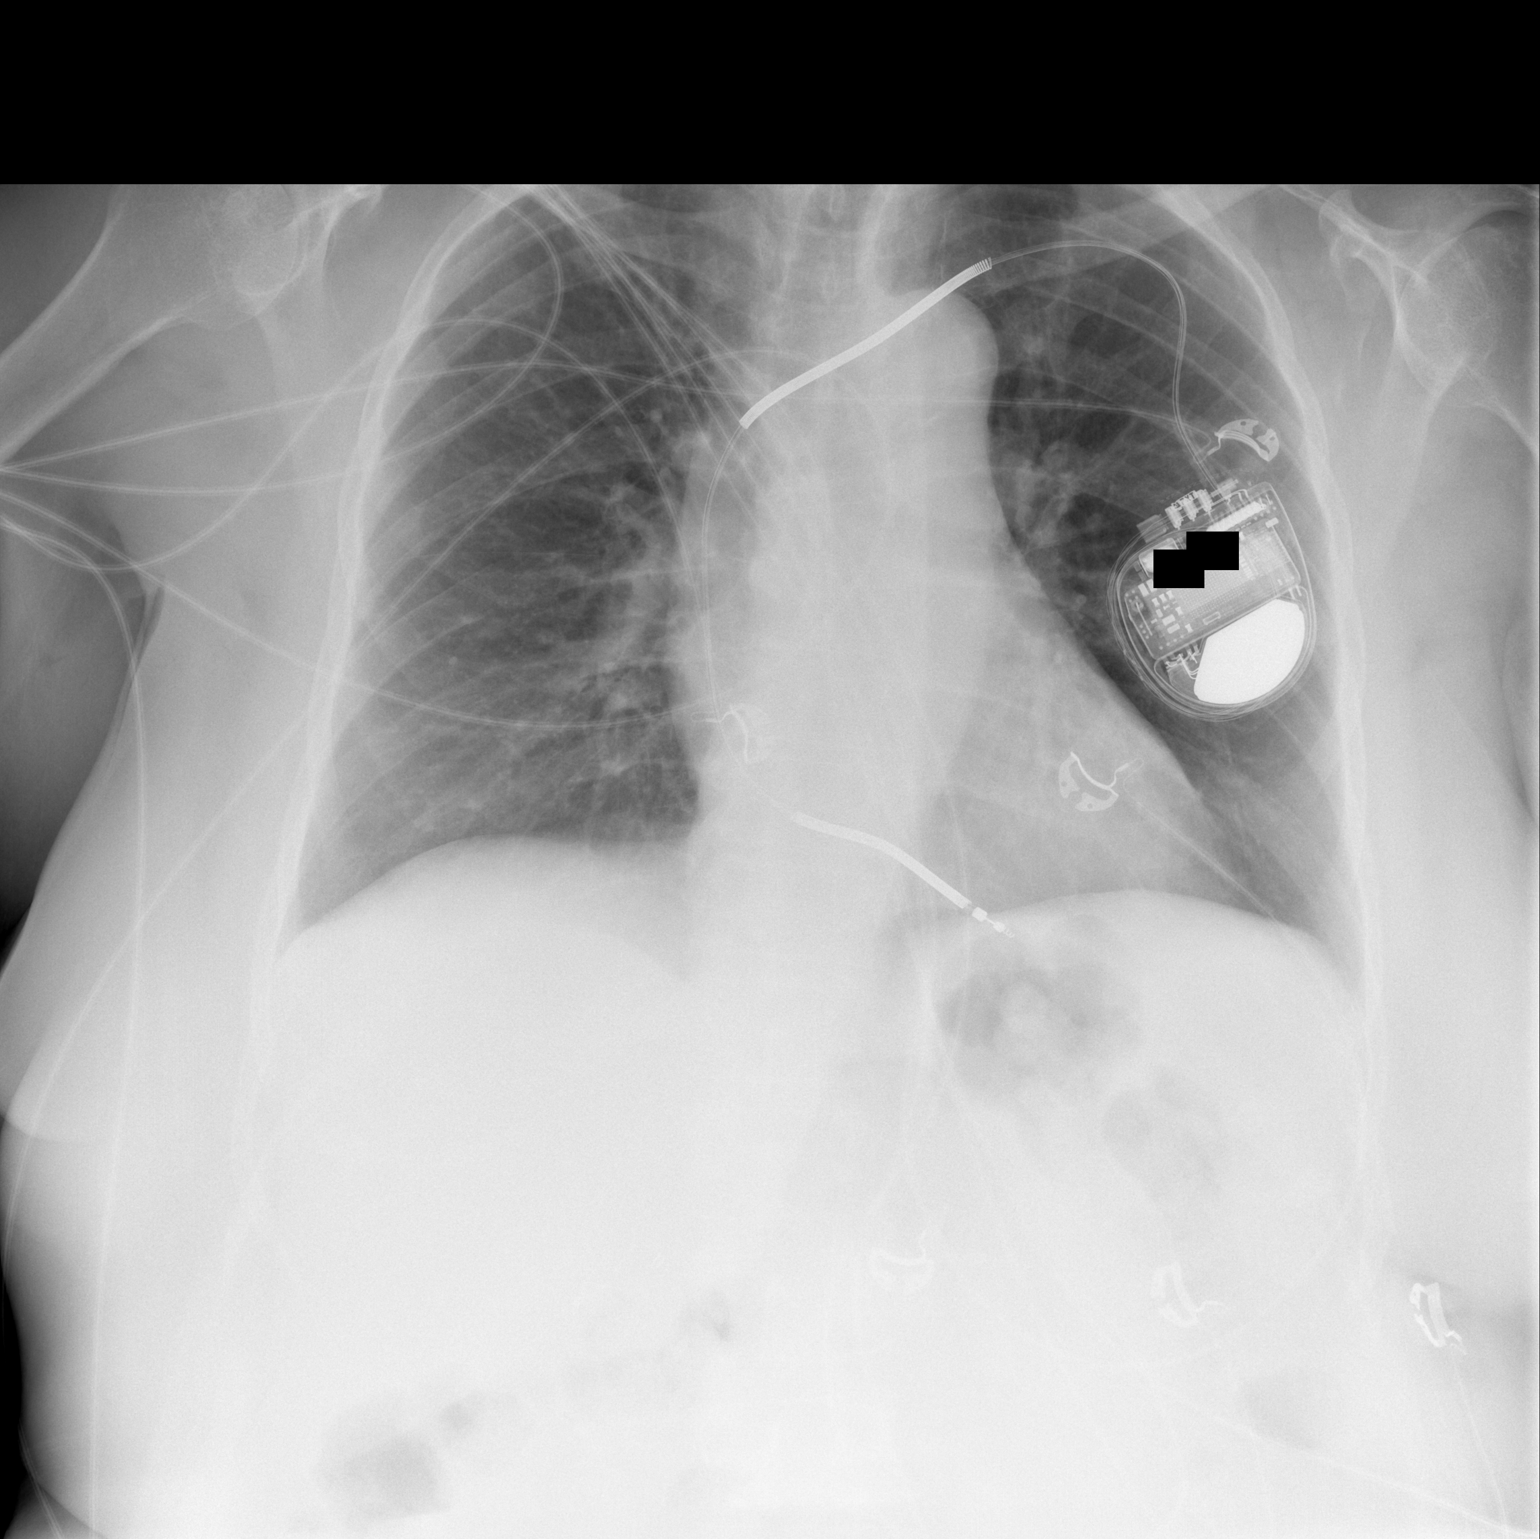

[w chest lat]
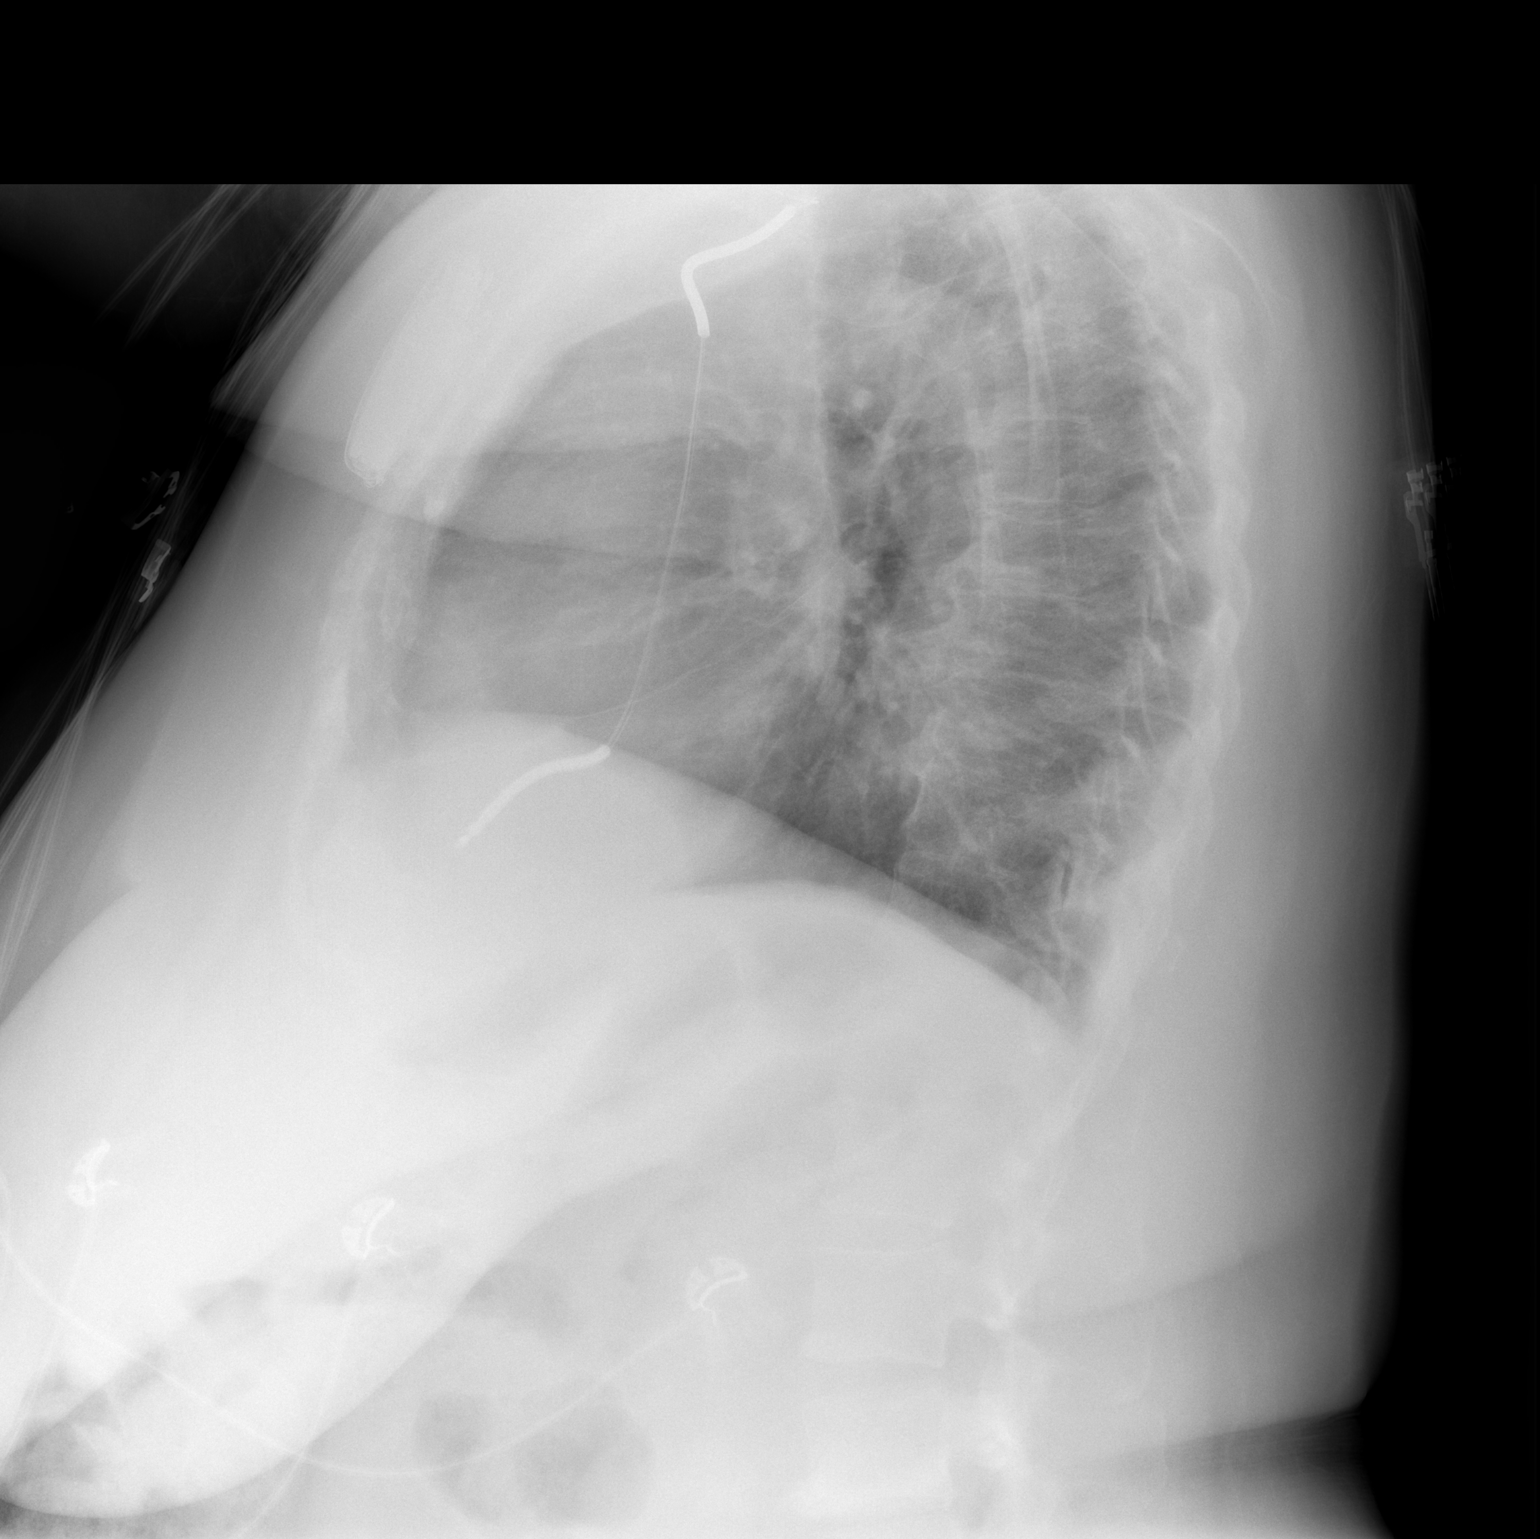

[2 of 2 positions shown; findings below may reference images not displayed]

FINDINGS: ICD device with right ventricular lead in place. Heart is top normal
with aortic atherosclerosis. No pneumonic consolidation, effusion or
pneumothorax. No overt pulmonary edema. Degenerative changes are
seen along the dorsal spine as before.
IMPRESSION: 1. No active cardiopulmonary disease.
2. Aortic atherosclerosis.
3. ICD device with right ventricular lead in place.

## 2023-10-25 ENCOUNTER — Other Ambulatory Visit (HOSPITAL_COMMUNITY): Payer: Self-pay | Admitting: Family Medicine

## 2023-10-25 DIAGNOSIS — R0602 Shortness of breath: Secondary | ICD-10-CM

## 2023-11-21 ENCOUNTER — Ambulatory Visit (HOSPITAL_COMMUNITY)
